# Patient Record
Sex: Male | Born: 1985 | Hispanic: No | Marital: Single | Smoking: Never smoker
Health system: Southern US, Community
[De-identification: ages and names within clinical notes are randomized; demographics above are authoritative.]

## PROBLEM LIST (undated history)

## (undated) DIAGNOSIS — S8990XA Unspecified injury of unspecified lower leg, initial encounter: Secondary | ICD-10-CM

## (undated) HISTORY — DX: Unspecified injury of unspecified lower leg, initial encounter: S89.90XA

---
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
## 2008-08-12 ENCOUNTER — Emergency Department: Payer: Self-pay

## 2008-08-12 ENCOUNTER — Encounter: Payer: Self-pay | Admitting: Family Medicine

## 2010-08-28 ENCOUNTER — Ambulatory Visit: Payer: Self-pay | Admitting: Family Medicine

## 2010-08-29 LAB — CONVERTED CEMR LAB
Calcium: 9.8 mg/dL (ref 8.4–10.5)
Cholesterol: 137 mg/dL (ref 0–200)
GFR calc non Af Amer: 82.96 mL/min (ref >60.00)
HDL: 34.9 mg/dL — ABNORMAL LOW (ref >39.00)
Potassium: 4.2 meq/L (ref 3.5–5.1)
Sodium: 141 meq/L (ref 135–145)
Triglycerides: 40 mg/dL (ref 0.0–149.0)
VLDL: 8 mg/dL (ref 0.0–40.0)

## 2010-10-16 NOTE — Letter (Signed)
Summary: Records from [facility] 2006 - 2009  Records from [facility] 2006 - 2009   Imported By: Maryln Gottron 09/16/2010 14:10:46  _____________________________________________________________________  External Attachment:    Type:   Image     Comment:   External Document

## 2010-10-16 NOTE — Assessment & Plan Note (Signed)
Summary: NEW PT TO EST/CLE   Vital Signs:  Patient profile:   25 year old male Height:      75.75 inches Weight:      164 pounds BMI:     20.17 Temp:     98.6 degrees F oral Pulse rate:   80 / minute Pulse rhythm:   regular BP sitting:   102 / 80  (right arm) Cuff size:   regular  Vitals Entered By: Linde Gillis CMA Duncan Dull) (August 28, 2010 10:45 AM) CC: new patient, establish care   History of Present Illness: 25 yo here to establish care with no complaints.  Had right knee/leg surgery requiring multiple screws after basketball injury in [facility].  Sometimes still hurts after exercising.  Very physically active- goes to the gym almost every day, also plays golf 2-3 times per week.  Wants to teach golf eventually.  Not sexually active.  Very close to his parents.  Preventive Screening-Counseling & Management  Alcohol-Tobacco     Smoking Status: never  Caffeine-Diet-Exercise     Does Patient Exercise: yes      Drug Use:  no.    Current Medications (verified): 1)  Vitamin C 500 Mg Chew (Ascorbic Acid) .... Take One Tablet By Mouth Daily  Allergies (verified): No Known Drug Allergies  Past History:  Family History: Last updated: 08/28/2010 Mom has CRF  Social History: Last updated: 08/28/2010 Single In college Regular exercise-yes Drug use-no Never Smoked Alcohol use-no [place] mom and stepdad  Risk Factors: Exercise: yes (08/28/2010)  Risk Factors: Smoking Status: never (08/28/2010)  Past Medical History: Unremarkable  Past Surgical History: Right knee/leg repair- multiple screws after basketball injury in 2004- [facility] [place]  Family History: Mom has CRF  Social History: Single In college Regular exercise-yes Drug use-no Never Smoked Alcohol use-no [place] with mom and stepdadDoes Patient Exercise:  yes Drug Use:  no Smoking Status:  never  Review of Systems      See HPI General:   Denies malaise. Eyes:  Denies blurring. ENT:  Denies difficulty swallowing. CV:  Denies chest pain or discomfort. Resp:  Denies shortness of breath. GI:  Denies abdominal pain, change in bowel habits, and constipation. GU:  Denies dysuria and genital sores. MS:  Denies joint pain, joint redness, and joint swelling. Derm:  Denies rash. Neuro:  Denies headaches. Psych:  Denies anxiety and depression. Endo:  Denies cold intolerance and heat intolerance. Heme:  Denies abnormal bruising and bleeding. Allergy:  Denies persistent infections and seasonal allergies.  Physical Exam  General:  alert, well-developed, well-nourished, and well-hydrated.   Head:  normocephalic and atraumatic.   Eyes:  vision grossly intact, pupils equal, and pupils round.   Ears:  R ear normal and L ear normal.   Nose:  no external deformity.   Mouth:  good dentition and no gingival abnormalities.   Neck:  No deformities, masses, or tenderness noted. Lungs:  Normal respiratory effort, chest expands symmetrically. Lungs are clear to auscultation, no crackles or wheezes. Heart:  Normal rate and regular rhythm. S1 and S2 normal without gallop, murmur, click, rub or other extra sounds. Abdomen:  Bowel sounds positive,abdomen soft and non-tender without masses, organomegaly or hernias noted. Msk:  No deformity or scoliosis noted of thoracic or lumbar spine.   Extremities:  no edema Neurologic:  alert & oriented X3 and gait normal.   Skin:  Intact without suspicious lesions or rashes Psych:  Cognition  and judgment appear intact. Alert and cooperative with normal attention span and concentration. No apparent delusions, illusions, hallucinations   Impression & Recommendations:  Problem # 1:  HEALTH SCREENING (ICD-V70.0) Reviewed preventive care protocols, scheduled due services, and updated immunizations Discussed nutrition, exercise, diet, and healthy lifestyle.  Orders: Venipuncture (70623) TLB-BMP (Basic  Metabolic Panel-BMET) (80048-METABOL)  Complete Medication List: 1)  Vitamin C 500 Mg Chew (Ascorbic acid) .... Take one tablet by mouth daily  Other Orders: TLB-Lipid Panel (80061-LIPID)   Orders Added: 1)  Venipuncture [36415] 2)  TLB-BMP (Basic Metabolic Panel-BMET) [80048-METABOL] 3)  TLB-Lipid Panel [80061-LIPID] 4)  New Patient 18-39 years [99385]    Current Allergies (reviewed today): No known allergies   Flu Vaccine Next Due:  Refused TD Result Date:  08/25/2006 TD Result:  historical

## 2010-12-18 ENCOUNTER — Encounter: Payer: Self-pay | Admitting: Family Medicine

## 2010-12-22 ENCOUNTER — Ambulatory Visit (INDEPENDENT_AMBULATORY_CARE_PROVIDER_SITE_OTHER): Payer: BLUE CROSS/BLUE SHIELD | Admitting: Family Medicine

## 2010-12-22 ENCOUNTER — Encounter: Payer: Self-pay | Admitting: Family Medicine

## 2010-12-22 VITALS — BP 116/78 | HR 84 | Temp 98.3°F | Ht 76.0 in | Wt 161.0 lb

## 2010-12-22 DIAGNOSIS — J069 Acute upper respiratory infection, unspecified: Secondary | ICD-10-CM

## 2010-12-22 MED ORDER — GUAIFENESIN-CODEINE 100-10 MG/5ML PO SYRP: 5.0000 mL | ORAL_SOLUTION | Freq: Three times a day (TID) | ORAL | Status: AC | PRN

## 2010-12-22 NOTE — Assessment & Plan Note (Signed)
supportive care as per instructions. cheratussin If not better in next few days, call us for likely zpack to cover atypicals in likely developing bronchitis.

## 2010-12-22 NOTE — Patient Instructions (Signed)
Sounds like you have a viral upper respiratory infection. Antibiotics are not needed for this.  Viral infections usually take 7-10 days to resolve.  The cough can last 4 weeks to go away. Use medication as prescribed: cheratussin at night Continue mucinex with plenty of fluid to help mobilize mucous out. Intranasal saline flush to drain sinuses. Push fluids and plenty of rest. Please return if you are not improving as expected, or if you have high fevers (>101.5) or difficulty swallowing or worsening productive cough. Call clinic with questions.  Pleasure to see you today.  If not feeling better by Thursday, or any worsening, call us with update.

## 2010-12-22 NOTE — Progress Notes (Signed)
  Subjective:    Patient ID: Jonathan Stewart, male    DOB: 1985/12/19, 25 y.o.   MRN: [id number]  HPI CC: ? Sinus infection  7d h/o nasal congestion, now seems to be going into throat and chest.  Affecting ability to work, decreasing sleep at night.  Trouble breathing and cough at night.  Using zyrtec D as well as mucinex, didn't help.  + myalgias.  + HA, sinus pressure.  Cough productive of clear mucous.  Nothing coming out of nose.  + sneezing and watery eyes initially, then progressed to more congestion.  Now congestion centered in chest.  No fevers/chills, abd pain, n/v/d, arthralgias, HA.  Both parents sick as well.    H/o bad allergies.  No h/o asthma, no smokers at home  Review of Systems Per HPI    Objective:   Physical Exam  [vitalsreviewed. Constitutional: He appears well-developed and well-nourished. No distress.  HENT:  Head: Normocephalic and atraumatic.  Right Ear: Tympanic membrane, external ear and ear canal normal.  Left Ear: Tympanic membrane, external ear and ear canal normal.  Nose: No mucosal edema or rhinorrhea. Right sinus exhibits maxillary sinus tenderness (mild). Right sinus exhibits no frontal sinus tenderness. Left sinus exhibits no maxillary sinus tenderness and no frontal sinus tenderness.  Mouth/Throat: Oropharynx is clear and moist. No oropharyngeal exudate.  Eyes: Conjunctivae and EOM are normal. Pupils are equal, round, and reactive to light. No scleral icterus.  Neck: Normal range of motion. Neck supple. No thyromegaly present.  Cardiovascular: Normal rate, regular rhythm, normal heart sounds and intact distal pulses.   No murmur heard. Pulmonary/Chest: Effort normal and breath sounds normal. No respiratory distress. He has no wheezes. He has no rales.  Lymphadenopathy:    He has no cervical adenopathy.  Skin: Skin is warm and dry. No rash noted.          Assessment & Plan:

## 2012-08-15 ENCOUNTER — Ambulatory Visit (INDEPENDENT_AMBULATORY_CARE_PROVIDER_SITE_OTHER): Payer: BLUE CROSS/BLUE SHIELD | Admitting: Family Medicine

## 2012-08-15 ENCOUNTER — Encounter: Payer: Self-pay | Admitting: Family Medicine

## 2012-08-15 VITALS — BP 124/90 | Temp 98.0°F | Wt 162.0 lb

## 2012-08-15 DIAGNOSIS — K529 Noninfective gastroenteritis and colitis, unspecified: Secondary | ICD-10-CM

## 2012-08-15 DIAGNOSIS — K5289 Other specified noninfective gastroenteritis and colitis: Secondary | ICD-10-CM

## 2012-08-15 MED ORDER — ONDANSETRON HCL 4 MG PO TABS: 4.0000 mg | ORAL_TABLET | Freq: Three times a day (TID) | ORAL | Status: DC | PRN

## 2012-08-15 NOTE — Patient Instructions (Addendum)
Viral Gastroenteritis Viral gastroenteritis is also known as stomach flu. This condition affects the stomach and intestinal tract. It can cause sudden diarrhea and vomiting. The illness typically lasts 3 to 8 days. Most people develop an immune response that eventually gets rid of the virus. While this natural response develops, the virus can make you quite ill. CAUSES  Many different viruses can cause gastroenteritis, such as rotavirus or noroviruses. You can catch one of these viruses by consuming contaminated food or water. You may also catch a virus by sharing utensils or other personal items with an infected person or by touching a contaminated surface. SYMPTOMS  The most common symptoms are diarrhea and vomiting. These problems can cause a severe loss of body fluids (dehydration) and a body salt (electrolyte) imbalance. Other symptoms may include:  Fever.  Headache.  Fatigue.  Abdominal pain. DIAGNOSIS  Your caregiver can usually diagnose viral gastroenteritis based on your symptoms and a physical exam. A stool sample may also be taken to test for the presence of viruses or other infections. TREATMENT  This illness typically goes away on its own. Treatments are aimed at rehydration. The most serious cases of viral gastroenteritis involve vomiting so severely that you are not able to keep fluids down. In these cases, fluids must be given through an intravenous line (IV). HOME CARE INSTRUCTIONS   Drink enough fluids to keep your urine clear or pale yellow. Drink small amounts of fluids frequently and increase the amounts as tolerated.  Ask your caregiver for specific rehydration instructions.  Avoid:  Foods high in sugar.  Alcohol.  Carbonated drinks.  Tobacco.  Juice.  Caffeine drinks.  Extremely hot or cold fluids.  Fatty, greasy foods.  Too much intake of anything at one time.  Dairy products until 24 to 48 hours after diarrhea stops.  You may consume probiotics.  Probiotics are active cultures of beneficial bacteria. They may lessen the amount and number of diarrheal stools in adults. Probiotics can be found in yogurt with active cultures and in supplements.  Wash your hands well to avoid spreading the virus.  Only take over-the-counter or prescription medicines for pain, discomfort, or fever as directed by your caregiver. Do not give aspirin to children. Antidiarrheal medicines are not recommended.  Ask your caregiver if you should continue to take your regular prescribed and over-the-counter medicines.  Keep all follow-up appointments as directed by your caregiver. SEEK IMMEDIATE MEDICAL CARE IF:   You are unable to keep fluids down.  You do not urinate at least once every 6 to 8 hours.  You develop shortness of breath.  You notice blood in your stool or vomit. This may look like coffee grounds.  You have abdominal pain that increases or is concentrated in one small area (localized).  You have persistent vomiting or diarrhea.  You have a fever.  The patient is a child younger than 3 months, and he or she has a fever.  The patient is a child older than 3 months, and he or she has a fever and persistent symptoms.  The patient is a child older than 3 months, and he or she has a fever and symptoms suddenly get worse.  The patient is a baby, and he or she has no tears when crying. MAKE SURE YOU:   Understand these instructions.  Will watch your condition.  Will get help right away if you are not doing well or get worse. Document Released: 08/31/2005 Document Revised: 11/23/2011 Document Reviewed: 06/17/2011   ExitCare Patient Information 2013 [employer].  

## 2012-08-15 NOTE — Progress Notes (Signed)
(  S) Jonathan Stewart is a 26 y.o. male with complaint of gastrointestinal symptoms of watery diarrhea, abdominal pain, lower abdominal cramps, nausea, vomiting for 2 days. No blood in stool.  Patient Active Problem List  Diagnosis  . Viral URI with cough   No past medical history on file. No past surgical history on file. History  Substance Use Topics  . Smoking status: Never Smoker   . Smokeless tobacco: Not on file  . Alcohol Use: Yes     Comment: Occasional   No family history on file. No Known Allergies Current Outpatient Prescriptions on File Prior to Visit  Medication Sig Dispense Refill  . Ascorbic Acid (VITAMIN C) 500 MG CHEW Chew 1 tablet by mouth daily.        . cetirizine-pseudoephedrine (ZYRTEC-D) 5-120 MG per tablet Take 1 tablet by mouth 2 (two) times daily as needed.        . fish oil-omega-3 fatty acids 1000 MG capsule Take 1 g by mouth daily.        . Glucosamine 500 MG CAPS Take 1 capsule by mouth daily.        . Multiple Vitamin (MULTIVITAMIN) tablet Take 1 tablet by mouth daily.         The PMH, PSH, Social History, Family History, Medications, and allergies have been reviewed in [facility], and have been updated if relevant.  (O) BP 124/90  Temp 98 F (36.7 C)  Wt 162 lb (73.483 kg)   Physical exam reveals the patient appears well. Hydration status: well hydrated. Abdomen: abdomen is soft without significant tenderness, masses, organomegaly or guarding..  (A) Viral Gastroenteritis  (P) 8 mg Zofran ODT given in office.  I have recommended small amounts clear fluids frequently, soups, juices, water and advance diet as tolerated. Return office visit if symptoms persist or worsen; I have alerted the patient to call if high fever, dehydration, marked weakness, fainting, increased abdominal pain, blood in stool or vomit.

## 2012-08-16 MED ORDER — ONDANSETRON 4 MG PO TBDP
4.0000 mg | ORAL_TABLET | Freq: Once | ORAL | Status: AC
  Administered 2012-08-15: 4 mg via ORAL

## 2012-08-16 NOTE — Addendum Note (Signed)
Addended by: Eliezer Bottom on: 08/16/2012 05:15 PM   Modules accepted: Orders

## 2013-12-18 ENCOUNTER — Ambulatory Visit (INDEPENDENT_AMBULATORY_CARE_PROVIDER_SITE_OTHER): Payer: BC Managed Care – PPO | Admitting: Internal Medicine

## 2013-12-18 ENCOUNTER — Encounter: Payer: Self-pay | Admitting: Internal Medicine

## 2013-12-18 VITALS — BP 118/76 | HR 82 | Temp 98.9°F | Wt 171.8 lb

## 2013-12-18 DIAGNOSIS — K529 Noninfective gastroenteritis and colitis, unspecified: Secondary | ICD-10-CM

## 2013-12-18 DIAGNOSIS — K5289 Other specified noninfective gastroenteritis and colitis: Secondary | ICD-10-CM

## 2013-12-18 MED ORDER — ONDANSETRON HCL 4 MG PO TABS: 4.0000 mg | ORAL_TABLET | Freq: Three times a day (TID) | ORAL | Status: AC | PRN

## 2013-12-18 NOTE — Patient Instructions (Addendum)
Viral Gastroenteritis Viral gastroenteritis is also known as stomach flu. This condition affects the stomach and intestinal tract. It can cause sudden diarrhea and vomiting. The illness typically lasts 3 to 8 days. Most people develop an immune response that eventually gets rid of the virus. While this natural response develops, the virus can make you quite ill. CAUSES  Many different viruses can cause gastroenteritis, such as rotavirus or noroviruses. You can catch one of these viruses by consuming contaminated food or water. You may also catch a virus by sharing utensils or other personal items with an infected person or by touching a contaminated surface. SYMPTOMS  The most common symptoms are diarrhea and vomiting. These problems can cause a severe loss of body fluids (dehydration) and a body salt (electrolyte) imbalance. Other symptoms may include:  Fever.  Headache.  Fatigue.  Abdominal pain. DIAGNOSIS  Your caregiver can usually diagnose viral gastroenteritis based on your symptoms and a physical exam. A stool sample may also be taken to test for the presence of viruses or other infections. TREATMENT  This illness typically goes away on its own. Treatments are aimed at rehydration. The most serious cases of viral gastroenteritis involve vomiting so severely that you are not able to keep fluids down. In these cases, fluids must be given through an intravenous line (IV). HOME CARE INSTRUCTIONS   Drink enough fluids to keep your urine clear or pale yellow. Drink small amounts of fluids frequently and increase the amounts as tolerated.  Ask your caregiver for specific rehydration instructions.  Avoid:  Foods high in sugar.  Alcohol.  Carbonated drinks.  Tobacco.  Juice.  Caffeine drinks.  Extremely hot or cold fluids.  Fatty, greasy foods.  Too much intake of anything at one time.  Dairy products until 24 to 48 hours after diarrhea stops.  You may consume probiotics.  Probiotics are active cultures of beneficial bacteria. They may lessen the amount and number of diarrheal stools in adults. Probiotics can be found in yogurt with active cultures and in supplements.  Wash your hands well to avoid spreading the virus.  Only take over-the-counter or prescription medicines for pain, discomfort, or fever as directed by your caregiver. Do not give aspirin to children. Antidiarrheal medicines are not recommended.  Ask your caregiver if you should continue to take your regular prescribed and over-the-counter medicines.  Keep all follow-up appointments as directed by your caregiver. SEEK IMMEDIATE MEDICAL CARE IF:   You are unable to keep fluids down.  You do not urinate at least once every 6 to 8 hours.  You develop shortness of breath.  You notice blood in your stool or vomit. This may look like coffee grounds.  You have abdominal pain that increases or is concentrated in one small area (localized).  You have persistent vomiting or diarrhea.  You have a fever.  The patient is a child younger than 3 months, and he or she has a fever.  The patient is a child older than 3 months, and he or she has a fever and persistent symptoms.  The patient is a child older than 3 months, and he or she has a fever and symptoms suddenly get worse.  The patient is a baby, and he or she has no tears when crying. MAKE SURE YOU:   Understand these instructions.  Will watch your condition.  Will get help right away if you are not doing well or get worse. Document Released: 08/31/2005 Document Revised: 11/23/2011 Document Reviewed: 06/17/2011   [employer] Patient Information 2014 [employer].  

## 2013-12-18 NOTE — Progress Notes (Signed)
Subjective:    Patient ID: Jonathan Stewart, male    DOB: 1986-03-02, 28 y.o.   MRN: [id number]  HPI  Pt presents to the clinic today with c/o abdominal cramping, diarrhea, nausea and vomiting. He reports this started. He has been running low grade fevers. Has had chills and body aches. He is unsure if he has had sick contacts but he reports his mom is having similar symptoms. He is concerned because his mom had a kidney transplant 1 year ago.  Review of Systems   History reviewed. No pertinent past medical history.  Current Outpatient Prescriptions  Medication Sig Dispense Refill  . Ascorbic Acid (VITAMIN C) 500 MG CHEW Chew 1 tablet by mouth daily.        . fish oil-omega-3 fatty acids 1000 MG capsule Take 1 g by mouth daily.        . Glucosamine 500 MG CAPS Take 1 capsule by mouth daily.        . Multiple Vitamin (MULTIVITAMIN) tablet Take 1 tablet by mouth daily.         No current facility-administered medications for this visit.    No Known Allergies  History reviewed. No pertinent family history.  History   Social History  . Marital Status: Single    Spouse Name: N/A    Number of Children: N/A  . Years of Education: N/A   Occupational History  . Not on file.   Social History Main Topics  . Smoking status: Never Smoker   . Smokeless tobacco: Not on file  . Alcohol Use: Yes     Comment: Occasional  . Drug Use: No  . Sexual Activity: Not on file   Other Topics Concern  . Not on file   Social History Narrative  . No narrative on file     Constitutional: Denies fever, malaise, fatigue, headache or abrupt weight changes.  HEENT: Denies eye pain, eye redness, ear pain, ringing in the ears, wax buildup, runny nose, nasal congestion, bloody nose, or sore throat. Respiratory: Denies difficulty breathing, shortness of breath, cough or sputum production.   Cardiovascular: Denies chest pain, chest tightness, palpitations or swelling in the hands or feet.    Gastrointestinal: Pt reports abdominal cramping, nausea, vomiting, diarrhea. Denies bloating, constipation, or blood in the stool.     No other specific complaints in a complete review of systems (except as listed in HPI above).   Objective:   Physical Exam  BP 118/76  Pulse 82  Temp(Src) 98.9 F (37.2 C) (Oral)  Wt 171 lb 12 oz (77.905 kg)  SpO2 98% Wt Readings from Last 3 Encounters:  12/18/13 171 lb 12 oz (77.905 kg)  08/15/12 162 lb (73.483 kg)  12/22/10 161 lb (73.029 kg)    General: Appears his stated age, well developed, well nourished in NAD. Cardiovascular: Normal rate and rhythm. S1,S2 noted.  No murmur, rubs or gallops noted. No JVD or BLE edema. No carotid bruits noted. Pulmonary/Chest: Normal effort and positive vesicular breath sounds. No respiratory distress. No wheezes, rales or ronchi noted.  Abdomen: Soft and diffusely tender. Normal bowel sounds, no bruits noted. No distention or masses noted. Liver, spleen and kidneys non palpable.        Assessment & Plan:   Gastroenteritis, viral:  Encouraged him to get rest and drink lots of fluids eRx for zofran Wash hands thoroughly to prevent spreading the virus I would encourage him to stay away from his mom at all possible. Work note  provided  RTC as needed or if symptoms persist or worsen

## 2013-12-18 NOTE — Progress Notes (Signed)
Pre visit review using our clinic review tool, if applicable. No additional management support is needed unless otherwise documented below in the visit note. 

## 2014-04-08 ENCOUNTER — Inpatient Hospital Stay (HOSPITAL_COMMUNITY)
Admission: EM | Admit: 2014-04-08 | Discharge: 2014-04-14 | DRG: 963 | Disposition: E | Payer: BC Managed Care – PPO | Attending: Surgery | Admitting: Surgery

## 2014-04-08 ENCOUNTER — Emergency Department (HOSPITAL_COMMUNITY): Payer: BC Managed Care – PPO

## 2014-04-08 DIAGNOSIS — S42009A Fracture of unspecified part of unspecified clavicle, initial encounter for closed fracture: Secondary | ICD-10-CM

## 2014-04-08 DIAGNOSIS — D62 Acute posthemorrhagic anemia: Secondary | ICD-10-CM | POA: Diagnosis present

## 2014-04-08 DIAGNOSIS — R000 Tachycardia, unspecified: Secondary | ICD-10-CM | POA: Diagnosis present

## 2014-04-08 DIAGNOSIS — S2220XA Unspecified fracture of sternum, initial encounter for closed fracture: Secondary | ICD-10-CM

## 2014-04-08 DIAGNOSIS — I469 Cardiac arrest, cause unspecified: Secondary | ICD-10-CM | POA: Diagnosis present

## 2014-04-08 DIAGNOSIS — J9600 Acute respiratory failure, unspecified whether with hypoxia or hypercapnia: Secondary | ICD-10-CM | POA: Diagnosis present

## 2014-04-08 DIAGNOSIS — G9382 Brain death: Secondary | ICD-10-CM | POA: Diagnosis present

## 2014-04-08 DIAGNOSIS — S32309A Unspecified fracture of unspecified ilium, initial encounter for closed fracture: Secondary | ICD-10-CM

## 2014-04-08 DIAGNOSIS — IMO0002 Reserved for concepts with insufficient information to code with codable children: Secondary | ICD-10-CM | POA: Diagnosis present

## 2014-04-08 DIAGNOSIS — I10 Essential (primary) hypertension: Secondary | ICD-10-CM | POA: Diagnosis present

## 2014-04-08 DIAGNOSIS — T794XXA Traumatic shock, initial encounter: Secondary | ICD-10-CM | POA: Diagnosis present

## 2014-04-08 DIAGNOSIS — J9383 Other pneumothorax: Secondary | ICD-10-CM | POA: Diagnosis present

## 2014-04-08 DIAGNOSIS — S066XAA Traumatic subarachnoid hemorrhage with loss of consciousness status unknown, initial encounter: Secondary | ICD-10-CM

## 2014-04-08 DIAGNOSIS — E876 Hypokalemia: Secondary | ICD-10-CM | POA: Diagnosis present

## 2014-04-08 DIAGNOSIS — R578 Other shock: Secondary | ICD-10-CM | POA: Diagnosis not present

## 2014-04-08 DIAGNOSIS — J9601 Acute respiratory failure with hypoxia: Secondary | ICD-10-CM

## 2014-04-08 DIAGNOSIS — S129XXA Fracture of neck, unspecified, initial encounter: Secondary | ICD-10-CM | POA: Diagnosis present

## 2014-04-08 DIAGNOSIS — S066X9A Traumatic subarachnoid hemorrhage with loss of consciousness of unspecified duration, initial encounter: Secondary | ICD-10-CM

## 2014-04-08 DIAGNOSIS — S14119A Complete lesion at unspecified level of cervical spinal cord, initial encounter: Secondary | ICD-10-CM

## 2014-04-08 LAB — CBC
HCT: 41.3 % (ref 39.0–52.0)
Hemoglobin: 13.9 g/dL (ref 13.0–17.0)
MCH: 29 pg (ref 26.0–34.0)
MCHC: 33.7 g/dL (ref 30.0–36.0)
MCV: 86 fL (ref 78.0–100.0)
Platelets: 248 10*3/uL (ref 150–400)
RBC: 4.8 MIL/uL (ref 4.22–5.81)
RDW: 12.6 % (ref 11.5–15.5)
WBC: 19.1 10*3/uL — ABNORMAL HIGH (ref 4.0–10.5)

## 2014-04-08 LAB — URINE MICROSCOPIC-ADD ON

## 2014-04-08 LAB — ETHANOL: Alcohol, Ethyl (B): <11 mg/dL (ref 0–11)

## 2014-04-08 LAB — I-STAT CHEM 8, ED
BUN: 15 mg/dL (ref 6–23)
CALCIUM ION: 1.06 mmol/L — AB (ref 1.12–1.23)
Chloride: 107 mEq/L (ref 96–112)
Creatinine, Ser: 1.4 mg/dL — ABNORMAL HIGH (ref 0.50–1.35)
Glucose, Bld: 210 mg/dL — ABNORMAL HIGH (ref 70–99)
HCT: 43 % (ref 39.0–52.0)
Hemoglobin: 14.6 g/dL (ref 13.0–17.0)
Potassium: 2.8 mEq/L — CL (ref 3.7–5.3)
Sodium: 138 mEq/L (ref 137–147)
TCO2: 19 mmol/L (ref 0–100)

## 2014-04-08 LAB — URINALYSIS, ROUTINE W REFLEX MICROSCOPIC
Bilirubin Urine: NEGATIVE
Glucose, UA: 250 mg/dL — AB
Ketones, ur: NEGATIVE mg/dL
LEUKOCYTES UA: NEGATIVE
Nitrite: NEGATIVE
PROTEIN: 30 mg/dL — AB
Specific Gravity, Urine: 1.014 (ref 1.005–1.030)
Urobilinogen, UA: 0.2 mg/dL (ref 0.0–1.0)
pH: 6.5 (ref 5.0–8.0)

## 2014-04-08 LAB — COMPREHENSIVE METABOLIC PANEL
ALT: 118 U/L — ABNORMAL HIGH (ref 0–53)
AST: 197 U/L — ABNORMAL HIGH (ref 0–37)
Albumin: 3.8 g/dL (ref 3.5–5.2)
Alkaline Phosphatase: 108 U/L (ref 39–117)
Anion gap: 20 — ABNORMAL HIGH (ref 5–15)
BUN: 15 mg/dL (ref 6–23)
CO2: 18 meq/L — AB (ref 19–32)
CREATININE: 1.21 mg/dL (ref 0.50–1.35)
Calcium: 8.1 mg/dL — ABNORMAL LOW (ref 8.4–10.5)
Chloride: 102 mEq/L (ref 96–112)
GFR, EST NON AFRICAN AMERICAN: 81 mL/min — AB (ref >90)
Glucose, Bld: 208 mg/dL — ABNORMAL HIGH (ref 70–99)
Potassium: 3.1 mEq/L — ABNORMAL LOW (ref 3.7–5.3)
Sodium: 140 mEq/L (ref 137–147)
Total Bilirubin: 0.2 mg/dL — ABNORMAL LOW (ref 0.3–1.2)
Total Protein: 7.3 g/dL (ref 6.0–8.3)

## 2014-04-08 LAB — PROTIME-INR
INR: 1.31 (ref 0.00–1.49)
Prothrombin Time: 16.3 seconds — ABNORMAL HIGH (ref 11.6–15.2)

## 2014-04-08 LAB — I-STAT TROPONIN, ED: Troponin i, poc: 0 ng/mL (ref 0.00–0.08)

## 2014-04-08 LAB — I-STAT CG4 LACTIC ACID, ED: Lactic Acid, Venous: 4.91 mmol/L — ABNORMAL HIGH (ref 0.5–2.2)

## 2014-04-08 LAB — CDS SEROLOGY

## 2014-04-08 MED ORDER — IOHEXOL 300 MG/ML  SOLN: 100.0000 mL | Freq: Once | INTRAMUSCULAR | Status: AC | PRN

## 2014-04-08 MED ORDER — NOREPINEPHRINE BITARTRATE 1 MG/ML IV SOLN
2.0000 ug/min | Freq: Once | INTRAVENOUS | Status: AC
  Administered 2014-04-08: 10 ug/min via INTRAVENOUS
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
## 2014-04-08 NOTE — ED Notes (Signed)
Pt returned from CT with Primary RN, Nolon RodKelly D. And EMT Marchelle FolksAmanda.

## 2014-04-08 NOTE — ED Notes (Signed)
Family updated as to patient's status. Mother made aware that pt is in the ED. Family states that they are coming.
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
## 2014-04-09 ENCOUNTER — Encounter (HOSPITAL_COMMUNITY)
Admission: EM | Admit: 2014-04-09 | Discharge: 2014-04-09 | Disposition: A | Discharge status: Home / Self Care | Payer: BC Managed Care – PPO | Source: Ambulatory Visit | Attending: Surgery | Admitting: Surgery

## 2014-04-09 ENCOUNTER — Inpatient Hospital Stay (HOSPITAL_COMMUNITY): Payer: BC Managed Care – PPO

## 2014-04-09 DIAGNOSIS — S270XXA Traumatic pneumothorax, initial encounter: Secondary | ICD-10-CM

## 2014-04-09 DIAGNOSIS — S2220XA Unspecified fracture of sternum, initial encounter for closed fracture: Secondary | ICD-10-CM

## 2014-04-09 DIAGNOSIS — S129XXA Fracture of neck, unspecified, initial encounter: Secondary | ICD-10-CM | POA: Diagnosis present

## 2014-04-09 DIAGNOSIS — IMO0002 Reserved for concepts with insufficient information to code with codable children: Secondary | ICD-10-CM

## 2014-04-09 DIAGNOSIS — S066XAA Traumatic subarachnoid hemorrhage with loss of consciousness status unknown, initial encounter: Secondary | ICD-10-CM

## 2014-04-09 DIAGNOSIS — S14119A Complete lesion at unspecified level of cervical spinal cord, initial encounter: Secondary | ICD-10-CM

## 2014-04-09 DIAGNOSIS — J9600 Acute respiratory failure, unspecified whether with hypoxia or hypercapnia: Secondary | ICD-10-CM

## 2014-04-09 DIAGNOSIS — S066X9A Traumatic subarachnoid hemorrhage with loss of consciousness of unspecified duration, initial encounter: Secondary | ICD-10-CM

## 2014-04-09 DIAGNOSIS — Z529 Donor of unspecified organ or tissue: Secondary | ICD-10-CM | POA: Insufficient documentation

## 2014-04-09 LAB — GAMMA GT: GGT: 43 U/L (ref 7–51)

## 2014-04-09 LAB — CBC WITH DIFFERENTIAL/PLATELET
Basophils Absolute: 0 10*3/uL (ref 0.0–0.1)
Basophils Relative: 0 % (ref 0–1)
EOS ABS: 0 10*3/uL (ref 0.0–0.7)
EOS PCT: 0 % (ref 0–5)
HCT: 25.9 % — ABNORMAL LOW (ref 39.0–52.0)
Hemoglobin: 9.1 g/dL — ABNORMAL LOW (ref 13.0–17.0)
Lymphocytes Relative: 6 % — ABNORMAL LOW (ref 12–46)
Lymphs Abs: 0.8 10*3/uL (ref 0.7–4.0)
MCH: 29.2 pg (ref 26.0–34.0)
MCHC: 35.1 g/dL (ref 30.0–36.0)
MCV: 83 fL (ref 78.0–100.0)
MONOS PCT: 5 % (ref 3–12)
Monocytes Absolute: 0.6 10*3/uL (ref 0.1–1.0)
NEUTROS PCT: 89 % — AB (ref 43–77)
Neutro Abs: 11.6 10*3/uL — ABNORMAL HIGH (ref 1.7–7.7)
Platelets: 146 10*3/uL — ABNORMAL LOW (ref 150–400)
RBC: 3.12 MIL/uL — ABNORMAL LOW (ref 4.22–5.81)
RDW: 12.8 % (ref 11.5–15.5)
WBC: 13 10*3/uL — ABNORMAL HIGH (ref 4.0–10.5)

## 2014-04-09 LAB — URINALYSIS, ROUTINE W REFLEX MICROSCOPIC
BILIRUBIN URINE: NEGATIVE
Glucose, UA: 250 mg/dL — AB
Hgb urine dipstick: NEGATIVE
Ketones, ur: NEGATIVE mg/dL
Leukocytes, UA: NEGATIVE
Nitrite: NEGATIVE
Protein, ur: NEGATIVE mg/dL
SPECIFIC GRAVITY, URINE: 1.029 (ref 1.005–1.030)
UROBILINOGEN UA: 0.2 mg/dL (ref 0.0–1.0)
pH: 5 (ref 5.0–8.0)

## 2014-04-09 LAB — COMPREHENSIVE METABOLIC PANEL
ALBUMIN: 3.3 g/dL — AB (ref 3.5–5.2)
ALK PHOS: 69 U/L (ref 39–117)
ALT: 73 U/L — ABNORMAL HIGH (ref 0–53)
ALT: 71 U/L — ABNORMAL HIGH (ref 0–53)
ANION GAP: 9 (ref 5–15)
AST: 84 U/L — AB (ref 0–37)
AST: 75 U/L — AB (ref 0–37)
Albumin: 2.8 g/dL — ABNORMAL LOW (ref 3.5–5.2)
Alkaline Phosphatase: 67 U/L (ref 39–117)
Anion gap: 9 (ref 5–15)
BILIRUBIN TOTAL: 0.3 mg/dL (ref 0.3–1.2)
BUN: 12 mg/dL (ref 6–23)
BUN: 11 mg/dL (ref 6–23)
CALCIUM: 7.9 mg/dL — AB (ref 8.4–10.5)
CHLORIDE: 106 meq/L (ref 96–112)
CO2: 23 mEq/L (ref 19–32)
CO2: 26 mEq/L (ref 19–32)
CREATININE: 1.23 mg/dL (ref 0.50–1.35)
Calcium: 7.3 mg/dL — ABNORMAL LOW (ref 8.4–10.5)
Chloride: 106 mEq/L (ref 96–112)
Creatinine, Ser: 1.24 mg/dL (ref 0.50–1.35)
GFR calc Af Amer: >90 mL/min (ref >90)
GFR calc non Af Amer: 80 mL/min — ABNORMAL LOW (ref >90)
GFR calc non Af Amer: 79 mL/min — ABNORMAL LOW (ref >90)
GLUCOSE: 124 mg/dL — AB (ref 70–99)
Glucose, Bld: 164 mg/dL — ABNORMAL HIGH (ref 70–99)
POTASSIUM: 5 meq/L (ref 3.7–5.3)
Potassium: 5.1 mEq/L (ref 3.7–5.3)
SODIUM: 141 meq/L (ref 137–147)
Sodium: 138 mEq/L (ref 137–147)
TOTAL PROTEIN: 6.2 g/dL (ref 6.0–8.3)
Total Bilirubin: 0.4 mg/dL (ref 0.3–1.2)
Total Protein: 5.5 g/dL — ABNORMAL LOW (ref 6.0–8.3)

## 2014-04-09 LAB — APTT: aPTT: 32 seconds (ref 24–37)

## 2014-04-09 LAB — CBC
HEMATOCRIT: 26.9 % — AB (ref 39.0–52.0)
Hemoglobin: 9.3 g/dL — ABNORMAL LOW (ref 13.0–17.0)
MCH: 28.9 pg (ref 26.0–34.0)
MCHC: 34.6 g/dL (ref 30.0–36.0)
MCV: 83.5 fL (ref 78.0–100.0)
Platelets: 170 10*3/uL (ref 150–400)
RBC: 3.22 MIL/uL — AB (ref 4.22–5.81)
RDW: 12.8 % (ref 11.5–15.5)
WBC: 11.6 10*3/uL — AB (ref 4.0–10.5)

## 2014-04-09 LAB — TYPE AND SCREEN
ABO/RH(D): A NEG
Antibody Screen: NEGATIVE
PT AG Type: NEGATIVE
UNIT DIVISION: 0
Unit division: 0
Weak D: NEGATIVE

## 2014-04-09 LAB — POCT I-STAT 3, ART BLOOD GAS (G3+)
Acid-Base Excess: 1 mmol/L (ref 0.0–2.0)
BICARBONATE: 27.7 meq/L — AB (ref 20.0–24.0)
O2 Saturation: 100 %
PCO2 ART: 52.6 mmHg — AB (ref 35.0–45.0)
PH ART: 7.329 — AB (ref 7.350–7.450)
PO2 ART: 478 mmHg — AB (ref 80.0–100.0)
TCO2: 29 mmol/L (ref 0–100)

## 2014-04-09 LAB — DIFFERENTIAL
Basophils Absolute: 0 10*3/uL (ref 0.0–0.1)
Basophils Relative: 0 % (ref 0–1)
EOS PCT: 0 % (ref 0–5)
Eosinophils Absolute: 0 10*3/uL (ref 0.0–0.7)
LYMPHS ABS: 2.4 10*3/uL (ref 0.7–4.0)
LYMPHS PCT: 20 % (ref 12–46)
MONO ABS: 1.1 10*3/uL — AB (ref 0.1–1.0)
Monocytes Relative: 10 % (ref 3–12)
Neutro Abs: 8.1 10*3/uL — ABNORMAL HIGH (ref 1.7–7.7)
Neutrophils Relative %: 70 % (ref 43–77)

## 2014-04-09 LAB — LACTATE DEHYDROGENASE: LDH: 267 U/L — AB (ref 94–250)

## 2014-04-09 LAB — LIPASE, BLOOD: LIPASE: 187 U/L — AB (ref 11–59)

## 2014-04-09 LAB — PREPARE FRESH FROZEN PLASMA
UNIT DIVISION: 0
Unit division: 0

## 2014-04-09 LAB — BILIRUBIN, DIRECT: Bilirubin, Direct: <0.2 mg/dL (ref 0.0–0.3)

## 2014-04-09 LAB — AMYLASE: Amylase: 236 U/L — ABNORMAL HIGH (ref 0–105)

## 2014-04-09 LAB — MAGNESIUM: MAGNESIUM: 1.9 mg/dL (ref 1.5–2.5)

## 2014-04-09 LAB — HEMOGLOBIN A1C
Hgb A1c MFr Bld: 5.6 % (ref <5.7)
MEAN PLASMA GLUCOSE: 114 mg/dL (ref <117)

## 2014-04-09 LAB — ABO/RH: ABO/RH(D): A NEG

## 2014-04-09 LAB — PHOSPHORUS: PHOSPHORUS: 3.4 mg/dL (ref 2.3–4.6)

## 2014-04-09 LAB — PROTIME-INR
INR: 1.21 (ref 0.00–1.49)
Prothrombin Time: 15.3 seconds — ABNORMAL HIGH (ref 11.6–15.2)

## 2014-04-09 LAB — MRSA PCR SCREENING: MRSA BY PCR: NEGATIVE

## 2014-04-09 MED ORDER — POTASSIUM CHLORIDE 10 MEQ/100ML IV SOLN
10.0000 meq | INTRAVENOUS | Status: AC
  Administered 2014-04-09 (×4): 10 meq via INTRAVENOUS
  Filled 2014-04-09 (×4): qty 100

## 2014-04-09 MED ORDER — PHENYLEPHRINE HCL 10 MG/ML IJ SOLN
30.0000 ug/min | INTRAMUSCULAR | Status: DC
  Filled 2014-04-09: qty 4

## 2014-04-09 MED ORDER — CHLORHEXIDINE GLUCONATE 0.12 % MT SOLN
15.0000 mL | Freq: Two times a day (BID) | OROMUCOSAL | Status: DC
  Administered 2014-04-09 – 2014-04-10 (×4): 15 mL via OROMUCOSAL
  Filled 2014-04-09 (×4): qty 15

## 2014-04-09 MED ORDER — SODIUM CHLORIDE 0.9 % IV SOLN
10.0000 ug/h | INTRAVENOUS | Status: DC
  Administered 2014-04-09: 10 ug/h via INTRAVENOUS
  Administered 2014-04-10 (×2): 20 ug/h via INTRAVENOUS
  Filled 2014-04-09 (×3): qty 10

## 2014-04-09 MED ORDER — LEVOTHYROXINE SODIUM 100 MCG IV SOLR
20.0000 ug | Freq: Once | INTRAVENOUS | Status: AC
  Administered 2014-04-09: 20 ug via INTRAVENOUS
  Filled 2014-04-09: qty 5

## 2014-04-09 MED ORDER — FUROSEMIDE 10 MG/ML IJ SOLN
20.0000 mg | Freq: Once | INTRAMUSCULAR | Status: AC
  Administered 2014-04-09: 20 mg via INTRAVENOUS
  Filled 2014-04-09: qty 2

## 2014-04-09 MED ORDER — DEXTROSE 50 % IV SOLN
50.0000 mL | Freq: Once | INTRAVENOUS | Status: AC
  Administered 2014-04-09: 50 mL via INTRAVENOUS
  Filled 2014-04-09: qty 50

## 2014-04-09 MED ORDER — SODIUM CHLORIDE 0.9 % IV SOLN
1000.0000 mg | Freq: Once | INTRAVENOUS | Status: AC
  Filled 2014-04-09: qty 8

## 2014-04-09 MED ORDER — DEXTROSE 5 % IV SOLN
2.0000 ug/min | INTRAVENOUS | Status: DC
  Administered 2014-04-09: 40 ug/min via INTRAVENOUS
  Filled 2014-04-09: qty 16

## 2014-04-09 MED ORDER — DEXTROSE 5 % IV SOLN
2.0000 ug/min | INTRAVENOUS | Status: DC
  Administered 2014-04-09: 50 ug/min via INTRAVENOUS
  Administered 2014-04-09: 40 ug/min via INTRAVENOUS
  Administered 2014-04-09 (×3): 50 ug/min via INTRAVENOUS
  Administered 2014-04-09: 40 ug/min via INTRAVENOUS
  Administered 2014-04-09: 50 ug/min via INTRAVENOUS
  Filled 2014-04-09 (×8): qty 4

## 2014-04-09 MED ORDER — BIOTENE DRY MOUTH MT LIQD
15.0000 mL | Freq: Four times a day (QID) | OROMUCOSAL | Status: DC
  Administered 2014-04-09 – 2014-04-10 (×4): 15 mL via OROMUCOSAL

## 2014-04-09 MED ORDER — VASOPRESSIN 20 UNIT/ML IJ SOLN
0.5000 [IU]/h | INTRAVENOUS | Status: DC
  Administered 2014-04-09: 0.5 [IU]/h via INTRAVENOUS
  Filled 2014-04-09: qty 1

## 2014-04-09 MED ORDER — DEXTROSE 5 % IV SOLN
30.0000 ug/min | INTRAVENOUS | Status: DC
  Administered 2014-04-09: 30 ug/min via INTRAVENOUS
  Filled 2014-04-09: qty 4

## 2014-04-09 MED ORDER — DEXTROSE 50 % IV SOLN
INTRAVENOUS | Status: AC
  Filled 2014-04-09: qty 50

## 2014-04-09 MED ORDER — ALBUMIN HUMAN 5 % IV SOLN
12.5000 g | Freq: Once | INTRAVENOUS | Status: AC
  Administered 2014-04-09: 12.5 g via INTRAVENOUS
  Filled 2014-04-09: qty 250

## 2014-04-09 MED ORDER — METHYLPREDNISOLONE SODIUM SUCC 1000 MG IJ SOLR
1000.0000 mg | INTRAMUSCULAR | Status: DC
  Filled 2014-04-09: qty 8

## 2014-04-09 MED ORDER — TECHNETIUM TC 99M EXAMETAZIME IV KIT
20.0000 | PACK | Freq: Once | INTRAVENOUS | Status: AC | PRN
  Administered 2014-04-09: 20 via INTRAVENOUS

## 2014-04-09 MED ORDER — SODIUM CHLORIDE 0.9 % IV SOLN
0.0300 [IU]/min | INTRAVENOUS | Status: DC
  Administered 2014-04-09: 0.03 [IU]/min via INTRAVENOUS
  Filled 2014-04-09: qty 2.5

## 2014-04-09 MED ORDER — INSULIN ASPART 100 UNIT/ML ~~LOC~~ SOLN
20.0000 [IU] | Freq: Once | SUBCUTANEOUS | Status: AC
  Administered 2014-04-09: 20 [IU] via SUBCUTANEOUS

## 2014-04-09 MED ORDER — SODIUM CHLORIDE 0.9 % IV SOLN
1000.0000 mg | INTRAVENOUS | Status: AC
  Administered 2014-04-09: 1000 mg via INTRAVENOUS
  Filled 2014-04-09: qty 8

## 2014-04-09 MED ORDER — SODIUM CHLORIDE 0.9 % IV SOLN
2.0000 ug/min | INTRAVENOUS | Status: DC
  Administered 2014-04-09: 40 ug/min via INTRAVENOUS
  Administered 2014-04-10: 8 ug/min via INTRAVENOUS
  Filled 2014-04-09 (×3): qty 16

## 2014-04-09 MED ORDER — PHENYLEPHRINE HCL 10 MG/ML IJ SOLN
30.0000 ug/min | INTRAVENOUS | Status: DC
  Administered 2014-04-09: 30 ug/min via INTRAVENOUS
  Administered 2014-04-09: 125 ug/min via INTRAVENOUS
  Administered 2014-04-09: 56.667 ug/min via INTRAVENOUS
  Filled 2014-04-09 (×3): qty 1

## 2014-04-09 MED ORDER — SODIUM CHLORIDE 0.9 % IV SOLN
INTRAVENOUS | Status: DC
  Administered 2014-04-09: 50 mL/h via INTRAVENOUS
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
## 2014-04-09 NOTE — Progress Notes (Signed)
Nutrition Brief Note  Chart reviewed. [facility], per MD exam consistent with brain death. Brain flow study pending. No further nutrition interventions warranted at this time.  Please re-consult as needed.   Kendell BaneHeather Phinehas Grounds RD, LDN, CNSC [phone] Pager [phone] After Hours Pager

## 2014-04-09 NOTE — ED Notes (Signed)
[facility] notified. Spoke with Jonathan Stewart. Coordinator Jonathan Stewart will return call. Referral number [id number]
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
## 2014-04-09 NOTE — ED Notes (Signed)
Pt transported to CT with Primary RN, Nolon RodKelly D. And La FolletteAmanda NT

## 2014-04-09 NOTE — ED Notes (Signed)
Family at bedside. 

## 2014-04-09 NOTE — Progress Notes (Signed)
Chaplain made follow up visit with family, escorting them to waiting area of 46M. Chaplain advised staff of family's location in waiting area and asked that they notify family when pt is ready for visitors. Chaplain offered prayer and emotional support to parents.

## 2014-04-09 NOTE — Progress Notes (Signed)
Patient ID: Jonathan Stewart, male   DOB: 03/18/1986, 28 y.o.   MRN: [id number] I spoke with his mother and other family at the bedside earlier this AM. Jonathan GelinasBurke [name], MD, MPH, FACS Trauma: [phone] General Surgery: [phone]
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
## 2014-04-09 NOTE — Progress Notes (Addendum)
Patient ID: Jonathan Stewart, male   DOB: April 15, 1986, 28 y.o.   MRN: [id number] Follow up - Trauma Critical Care  Patient Details:    Jonathan Stewart is an 28 y.o. male.  Lines/tubes : Airway 8 mm (Active)  Secured at (cm) 27 cm 04/09/2014  7:33 AM  Measured From Lips 04/09/2014  7:33 AM  Secured Location Right 04/09/2014  7:33 AM  Secured By [employer] 04/09/2014  7:33 AM  Tube Holder Repositioned Yes 04/09/2014  7:33 AM  Site Condition Dry 04/09/2014  1:40 AM     Chest Tube Left;Lateral Pleural (Active)  Suction -20 cm H2O 04/09/2014  6:00 AM  Chest Tube Air Leak None 04/09/2014  6:00 AM  Patency Intervention Milked;Tip/tilt 04/09/2014  6:00 AM  Drainage Description Bright red 04/09/2014  6:00 AM  Dressing Status Clean;Dry;Intact 04/09/2014  6:00 AM  Site Assessment Clean;Dry;Intact 04/09/2014  6:00 AM  Surrounding Skin Dry;Intact 04/09/2014  6:00 AM  Output (mL) 80 mL 04/09/2014  6:00 AM     Urethral Catheter Derrell, EMT  Temperature probe 16 Fr. (Active)  Indication for Insertion or Continuance of Catheter Unstable critical patients (first 24-48 hours) 04/09/2014  1:40 AM  Site Assessment Clean;Intact 04/09/2014  1:40 AM  Catheter Maintenance Bag below level of bladder;Catheter secured;Drainage bag/tubing not touching floor;No dependent loops;Seal intact;Insertion date on drainage bag 04/09/2014  1:40 AM  Collection Container Standard drainage bag 04/09/2014  1:40 AM  Securement Method Leg strap 04/09/2014  1:40 AM  Urinary Catheter Interventions Unclamped 04/09/2014  1:40 AM    Microbiology/Sepsis markers: Results for orders placed during the hospital encounter of 20-Apr-2014  MRSA PCR SCREENING     Status: None   Collection Time    04/09/14  1:45 AM      Result Value Ref Range Status   MRSA by PCR NEGATIVE  NEGATIVE Final   Comment:            The GeneXpert MRSA Assay (FDA     approved for NASAL specimens     only), is one component of a     comprehensive MRSA colonization      surveillance program. It is not     intended to diagnose MRSA     infection nor to guide or     monitor treatment for     MRSA infections.    Anti-infectives:  Anti-infectives   None      Best Practice/Protocols:  VTE Prophylaxis: Mechanical no sedation  Consults: Treatment Team:  Carmela Hurt, MD    Studies:    Events:  Subjective:    Overnight Issues:   Objective:  Vital signs for last 24 hours: Temp:  [97.4 F (36.3 C)-98.6 F (37 C)] 98.6 F (37 C) (07/27 0806) Pulse Rate:  [115-163] 128 (07/27 0830) Resp:  [12-20] 12 (07/27 0830) BP: (51-200)/(24-122) 114/57 mmHg (07/27 0830) SpO2:  [97 %-100 %] 99 % (07/27 0830) FiO2 (%):  [50 %-100 %] 50 % (07/27 0800) Weight:  [174 lb 6.1 oz (79.1 kg)-180 lb (81.647 kg)] 174 lb 6.1 oz (79.1 kg) (07/27 0140)  Hemodynamic parameters for last 24 hours:    Intake/Output from previous day: 07/26 0701 - 07/27 0700 In: 3946.3 [I.V.:3946.3] Out: 3310 [Urine:3230; Chest Tube:80]  Intake/Output this shift: Total I/O In: 282.5 [I.V.:282.5] Out: -   Vent settings for last 24 hours: Vent Mode:  [-] PRVC FiO2 (%):  [50 %-100 %] 50 % Set Rate:  [12 bmp-18 bmp] 12 bmp  Vt Set:  [550 mL-580 mL] 550 mL PEEP:  [5 cmH20] 5 cmH20 Plateau Pressure:  [14 cmH20] 14 cmH20  Physical Exam:  General: on vent Neuro: pupils fixed and dilated, no gag, no corneal, no movement to pain, no spont resp HEENT/Neck: ETT and collar Resp: clear to auscultation bilaterally CVS: RRR GI: soft, ND, quiet Extremities: claves soft  Results for orders placed during the hospital encounter of 03/27/2014 (from the past 24 hour(s))  PREPARE FRESH FROZEN PLASMA     Status: None   Collection Time    04/06/2014 10:06 PM      Result Value Ref Range   Unit Number [id number]     Blood Component Type THAWED PLASMA     Unit division 00     [facility]     Unit tag comment VERBAL ORDERS PER DR DELO     Transfusion Status OK TO  TRANSFUSE     Unit Number [id number]     Blood Component Type THAWED PLASMA     Unit division 00     [facility] comment VERBAL ORDERS PER DR DELO     Transfusion Status OK TO TRANSFUSE    TYPE AND SCREEN     Status: None   Collection Time    03/14/2014 10:34 PM      Result Value Ref Range   ABO/RH(D) A NEG     Antibody Screen NEG     Sample Expiration 03/17/2014     Unit Number [id number]     Blood Component Type RED CELLS,LR     Unit division 00     [facility]     Unit tag comment VERBAL ORDERS PER DR DELO     Transfusion Status OK TO TRANSFUSE     Crossmatch Result COMPATIBLE     Unit Number [id number]     Blood Component Type RED CELLS,LR     Unit division 00     [facility]     Unit tag comment VERBAL ORDERS PER DR DELO     Transfusion Status OK TO TRANSFUSE     Crossmatch Result COMPATIBLE    ABO/RH     Status: None   Collection Time    03/22/2014 10:34 PM      Result Value Ref Range   ABO/RH(D) A NEG    I-STAT TROPOININ, ED     Status: None   Collection Time    04/12/2014 10:40 PM      Result Value Ref Range   Troponin i, poc 0.00  0.00 - 0.08 ng/mL   Comment 3           CDS SEROLOGY     Status: None   Collection Time    04/02/2014 10:41 PM      Result Value Ref Range   CDS serology specimen       Value: SPECIMEN WILL BE HELD FOR 14 DAYS IF TESTING IS REQUIRED  COMPREHENSIVE METABOLIC PANEL     Status: Abnormal   Collection Time    03/27/2014 10:41 PM      Result Value Ref Range   Sodium 140  137 - 147 mEq/L   Potassium 3.1 (*) 3.7 - 5.3 mEq/L   Chloride 102  96 - 112 mEq/L   CO2 18 (*) 19 - 32 mEq/L   Glucose, Bld 208 (*) 70 -  99 mg/dL   BUN 15  6 - 23 mg/dL   Creatinine, Ser 1.61  0.50 - 1.35 mg/dL   Calcium 8.1 (*) 8.4 - 10.5 mg/dL   Total Protein 7.3  6.0 - 8.3 g/dL   Albumin 3.8  3.5 - 5.2 g/dL   AST 096 (*) 0 - 37 U/L   ALT 118 (*) 0 - 53 U/L   Alkaline Phosphatase 108  39 -  117 U/L   Total Bilirubin 0.2 (*) 0.3 - 1.2 mg/dL   GFR calc non Af Amer 81 (*) >90 mL/min   GFR calc Af Amer >90  >90 mL/min   Anion gap 20 (*) 5 - 15  CBC     Status: Abnormal   Collection Time    04/09/2014 10:41 PM      Result Value Ref Range   WBC 19.1 (*) 4.0 - 10.5 K/uL   RBC 4.80  4.22 - 5.81 MIL/uL   Hemoglobin 13.9  13.0 - 17.0 g/dL   HCT 04.5  40.9 - 81.1 %   MCV 86.0  78.0 - 100.0 fL   MCH 29.0  26.0 - 34.0 pg   MCHC 33.7  30.0 - 36.0 g/dL   RDW 91.4  78.2 - 95.6 %   Platelets 248  150 - 400 K/uL  ETHANOL     Status: None   Collection Time    03/23/2014 10:41 PM      Result Value Ref Range   Alcohol, Ethyl (B) <11  0 - 11 mg/dL  PROTIME-INR     Status: Abnormal   Collection Time    04/05/2014 10:41 PM      Result Value Ref Range   Prothrombin Time 16.3 (*) 11.6 - 15.2 seconds   INR 1.31  0.00 - 1.49  I-STAT CHEM 8, ED     Status: Abnormal   Collection Time    03/21/2014 10:42 PM      Result Value Ref Range   Sodium 138  137 - 147 mEq/L   Potassium 2.8 (*) 3.7 - 5.3 mEq/L   Chloride 107  96 - 112 mEq/L   BUN 15  6 - 23 mg/dL   Creatinine, Ser 2.13 (*) 0.50 - 1.35 mg/dL   Glucose, Bld 086 (*) 70 - 99 mg/dL   Calcium, Ion 5.78 (*) 1.12 - 1.23 mmol/L   TCO2 19  0 - 100 mmol/L   Hemoglobin 14.6  13.0 - 17.0 g/dL   HCT 46.9  62.9 - 52.8 %   Comment NOTIFIED PHYSICIAN    I-STAT CG4 LACTIC ACID, ED     Status: Abnormal   Collection Time    04/13/2014 10:42 PM      Result Value Ref Range   Lactic Acid, Venous 4.91 (*) 0.5 - 2.2 mmol/L  URINALYSIS, ROUTINE W REFLEX MICROSCOPIC     Status: Abnormal   Collection Time    03/25/2014 10:45 PM      Result Value Ref Range   Color, Urine YELLOW  YELLOW   APPearance CLEAR  CLEAR   Specific Gravity, Urine 1.014  1.005 - 1.030   pH 6.5  5.0 - 8.0   Glucose, UA 250 (*) NEGATIVE mg/dL   Hgb urine dipstick LARGE (*) NEGATIVE   Bilirubin Urine NEGATIVE  NEGATIVE   Ketones, ur NEGATIVE  NEGATIVE mg/dL   Protein, ur 30 (*) NEGATIVE  mg/dL   Urobilinogen, UA 0.2  0.0 - 1.0 mg/dL   Nitrite NEGATIVE  NEGATIVE  Leukocytes, UA NEGATIVE  NEGATIVE  URINE MICROSCOPIC-ADD ON     Status: Abnormal   Collection Time    03/14/2014 10:45 PM      Result Value Ref Range   Squamous Epithelial / LPF RARE  RARE   RBC / HPF 3-6  <3 RBC/hpf   Bacteria, UA RARE  RARE   Casts GRANULAR CAST (*) NEGATIVE  MRSA PCR SCREENING     Status: None   Collection Time    04/09/14  1:45 AM      Result Value Ref Range   MRSA by PCR NEGATIVE  NEGATIVE    Assessment & Plan: Present on Admission:  . 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 . [facility] TBI/SAH - exam C/W brain death, check nuc med flow study Atlanto occipital separation with FX - includes canal compromise, collar CV - neurogenic shock, levophed and neo to support VDRF - full support, check ABG FEN - hypokalemia - replace K VTE - PAS DIspo - as above. Mother has requested organ donation. CDS on unit. Await brain flow study. Critical Care Total Time*: 44 Minutes  Violeta Gelinas, MD, MPH, [facility] Trauma: [phone] General Surgery: [phone]  04/09/2014  *Care during the described time interval was provided by me. I have reviewed this patient's available data, including medical history, events of note, physical examination and test results as part of my evaluation.

## 2014-04-09 NOTE — ED Notes (Signed)
Family updated as to patient's status. Md Delo, chaplain and primary RN Tresa EndoKelly in family consult room.
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
## 2014-04-09 NOTE — Procedures (Signed)
Arterial Catheter Insertion Procedure Note Rick Dufficolas R Edmunds [id number] 11/13/1985  Procedure: Insertion of Arterial Catheter  Indications: Blood pressure monitoring  Procedure Details Consent: Risks of procedure as well as the alternatives and risks of each were explained to the (patient/caregiver).  Consent for procedure obtained. Time Out: Verified patient identification, verified procedure, site/side was marked, verified correct patient position, special equipment/implants available, medications/allergies/relevent history reviewed, required imaging and test results available.  Performed  Maximum sterile technique was used including antiseptics, cap, gloves, gown, hand hygiene, mask and sheet. Skin prep: Chlorhexidine; local anesthetic administered Femoral catheter was inserted into right femoral artery using the Seldinger technique.  Evaluation Blood flow good; BP tracing good. Complications: No apparent complications.   Joffre Lucks E 04/09/2014

## 2014-04-09 NOTE — Progress Notes (Signed)
Patient ID: Jonathan Stewart R Wiltrout, male   DOB: 12/12/1985, 28 y.o.   MRN: [id number] NM flow study shows no brain flow. 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 I spoke with his family at the bedside. CDS present. [name] Berry Gallacher, MD, MPH, FACS Trauma: [phone] General Surgery: [phone]

## 2014-04-09 NOTE — Procedures (Signed)
Central Venous Catheter Insertion Procedure Note Jonathan Stewart [id number] 03/18/1986  Procedure: Insertion of Central Venous Catheter Indications: Drug and/or fluid administration  Procedure Details Consent: Risks of procedure as well as the alternatives and risks of each were explained to the (patient/caregiver).  Consent for procedure obtained. Time Out: Verified patient identification, verified procedure, site/side was marked, verified correct patient position, special equipment/implants available, medications/allergies/relevent history reviewed, required imaging and test results available.  Performed  Maximum sterile technique was used including antiseptics, cap, gloves, gown, hand hygiene, mask and sheet. Skin prep: Chlorhexidine; local anesthetic administered A antimicrobial bonded/coated triple lumen catheter was placed in the L femoral vein using the Seldinger technique after multiple failed attempts to access the L subclavian vein.  Evaluation Blood flow good Complications: No apparent complications Patient did tolerate procedure well. Chest X-ray ordered to R/C complication. Already has CT on L.  Jonathan Stewart E 04/09/2014, 12:06 PM

## 2014-04-09 NOTE — ED Notes (Signed)
Report given to Josh, RN.

## 2014-04-09 NOTE — Clinical Social Work Note (Signed)
Clinical Social Worker present at bedside when patient family notified of NM flow study results.  Patient family appropriately grieving.  Patient mother has already expressed interest in [facility] and they are already involved.  RN spoke with patient father regarding clay handprints - patient father in agreement and handprints completed for family.  CSW remains available for support to patient family as needed.  Macario GoldsJesse Krishav Mamone, Kentucky[place]CS[place] [phone]

## 2014-04-09 NOTE — ED Notes (Signed)
Attending RN at patient bedside throughout patient stay in the ED.

## 2014-04-10 ENCOUNTER — Inpatient Hospital Stay (HOSPITAL_COMMUNITY): Payer: BC Managed Care – PPO

## 2014-04-10 ENCOUNTER — Encounter (HOSPITAL_COMMUNITY): Payer: Self-pay | Admitting: Anesthesiology

## 2014-04-10 ENCOUNTER — Encounter (HOSPITAL_COMMUNITY): Admission: EM | Disposition: E | Payer: Self-pay | Source: Home / Self Care

## 2014-04-10 DIAGNOSIS — T794XXA Traumatic shock, initial encounter: Secondary | ICD-10-CM | POA: Diagnosis not present

## 2014-04-10 DIAGNOSIS — IMO0002 Reserved for concepts with insufficient information to code with codable children: Secondary | ICD-10-CM | POA: Diagnosis not present

## 2014-04-10 DIAGNOSIS — I319 Disease of pericardium, unspecified: Secondary | ICD-10-CM

## 2014-04-10 DIAGNOSIS — J9600 Acute respiratory failure, unspecified whether with hypoxia or hypercapnia: Secondary | ICD-10-CM | POA: Diagnosis not present

## 2014-04-10 DIAGNOSIS — S066X9A Traumatic subarachnoid hemorrhage with loss of consciousness of unspecified duration, initial encounter: Secondary | ICD-10-CM | POA: Diagnosis not present

## 2014-04-10 HISTORY — PX: ORGAN PROCUREMENT: SHX5270

## 2014-04-10 LAB — CK TOTAL AND CKMB (NOT AT ARMC)
CK, MB: 15.7 ng/mL (ref 0.3–4.0)
RELATIVE INDEX: 0.5 (ref 0.0–2.5)
Total CK: 3220 U/L — ABNORMAL HIGH (ref 7–232)

## 2014-04-10 LAB — CBC WITH DIFFERENTIAL/PLATELET
BASOS ABS: 0 10*3/uL (ref 0.0–0.1)
BASOS PCT: 0 % (ref 0–1)
Basophils Absolute: 0 10*3/uL (ref 0.0–0.1)
Basophils Relative: 0 % (ref 0–1)
EOS ABS: 0 10*3/uL (ref 0.0–0.7)
EOS ABS: 0 10*3/uL (ref 0.0–0.7)
EOS PCT: 0 % (ref 0–5)
Eosinophils Relative: 0 % (ref 0–5)
HCT: 20.6 % — ABNORMAL LOW (ref 39.0–52.0)
HCT: 24.5 % — ABNORMAL LOW (ref 39.0–52.0)
HEMATOCRIT: 18.8 % — AB (ref 39.0–52.0)
Hemoglobin: 7.3 g/dL — ABNORMAL LOW (ref 13.0–17.0)
Hemoglobin: 6.6 g/dL — CL (ref 13.0–17.0)
Hemoglobin: 8.4 g/dL — ABNORMAL LOW (ref 13.0–17.0)
Lymphocytes Relative: 9 % — ABNORMAL LOW (ref 12–46)
Lymphocytes Relative: 9 % — ABNORMAL LOW (ref 12–46)
Lymphs Abs: 1.2 10*3/uL (ref 0.7–4.0)
Lymphs Abs: 1.2 10*3/uL (ref 0.7–4.0)
MCH: 29.3 pg (ref 26.0–34.0)
MCH: 29.3 pg (ref 26.0–34.0)
MCH: 30 pg (ref 26.0–34.0)
MCHC: 35.4 g/dL (ref 30.0–36.0)
MCHC: 35.1 g/dL (ref 30.0–36.0)
MCHC: 34.3 g/dL (ref 30.0–36.0)
MCV: 82.7 fL (ref 78.0–100.0)
MCV: 83.6 fL (ref 78.0–100.0)
MCV: 87.5 fL (ref 78.0–100.0)
MONO ABS: 1.2 10*3/uL — AB (ref 0.1–1.0)
Monocytes Absolute: 0.8 10*3/uL (ref 0.1–1.0)
Monocytes Relative: 6 % (ref 3–12)
Monocytes Relative: 9 % (ref 3–12)
NEUTROS PCT: 85 % — AB (ref 43–77)
NEUTROS PCT: 82 % — AB (ref 43–77)
Neutro Abs: 11.5 10*3/uL — ABNORMAL HIGH (ref 1.7–7.7)
Neutro Abs: 10.9 10*3/uL — ABNORMAL HIGH (ref 1.7–7.7)
PLATELETS: 121 10*3/uL — AB (ref 150–400)
PLATELETS: 119 10*3/uL — AB (ref 150–400)
Platelets: 109 10*3/uL — ABNORMAL LOW (ref 150–400)
RBC: 2.49 MIL/uL — ABNORMAL LOW (ref 4.22–5.81)
RBC: 2.25 MIL/uL — AB (ref 4.22–5.81)
RBC: 2.8 MIL/uL — AB (ref 4.22–5.81)
RDW: 12.8 % (ref 11.5–15.5)
RDW: 13 % (ref 11.5–15.5)
RDW: 13.7 % (ref 11.5–15.5)
WBC: 13.6 10*3/uL — AB (ref 4.0–10.5)
WBC: 13.2 10*3/uL — ABNORMAL HIGH (ref 4.0–10.5)
WBC: 12 10*3/uL — ABNORMAL HIGH (ref 4.0–10.5)

## 2014-04-10 LAB — COMPREHENSIVE METABOLIC PANEL
ALBUMIN: 2.9 g/dL — AB (ref 3.5–5.2)
ALK PHOS: 58 U/L (ref 39–117)
ALK PHOS: 61 U/L (ref 39–117)
ALT: 56 U/L — ABNORMAL HIGH (ref 0–53)
ALT: 50 U/L (ref 0–53)
ALT: 48 U/L (ref 0–53)
ANION GAP: 9 (ref 5–15)
ANION GAP: 9 (ref 5–15)
AST: 57 U/L — ABNORMAL HIGH (ref 0–37)
AST: 49 U/L — ABNORMAL HIGH (ref 0–37)
AST: 46 U/L — ABNORMAL HIGH (ref 0–37)
Albumin: 2.8 g/dL — ABNORMAL LOW (ref 3.5–5.2)
Albumin: 2.9 g/dL — ABNORMAL LOW (ref 3.5–5.2)
Alkaline Phosphatase: 57 U/L (ref 39–117)
Anion gap: 10 (ref 5–15)
BILIRUBIN TOTAL: 0.4 mg/dL (ref 0.3–1.2)
BILIRUBIN TOTAL: 1 mg/dL (ref 0.3–1.2)
BUN: 10 mg/dL (ref 6–23)
BUN: 10 mg/dL (ref 6–23)
BUN: 9 mg/dL (ref 6–23)
CALCIUM: 8.1 mg/dL — AB (ref 8.4–10.5)
CHLORIDE: 109 meq/L (ref 96–112)
CHLORIDE: 114 meq/L — AB (ref 96–112)
CO2: 25 meq/L (ref 19–32)
CO2: 26 mEq/L (ref 19–32)
CO2: 27 mEq/L (ref 19–32)
CREATININE: 1.25 mg/dL (ref 0.50–1.35)
CREATININE: 1.12 mg/dL (ref 0.50–1.35)
Calcium: 7.7 mg/dL — ABNORMAL LOW (ref 8.4–10.5)
Calcium: 8.5 mg/dL (ref 8.4–10.5)
Chloride: 110 mEq/L (ref 96–112)
Creatinine, Ser: 1.11 mg/dL (ref 0.50–1.35)
GFR calc Af Amer: >90 mL/min (ref >90)
GFR calc Af Amer: >90 mL/min (ref >90)
GFR calc Af Amer: >90 mL/min (ref >90)
GFR calc non Af Amer: >90 mL/min (ref >90)
GFR, EST NON AFRICAN AMERICAN: 78 mL/min — AB (ref >90)
GFR, EST NON AFRICAN AMERICAN: 89 mL/min — AB (ref >90)
GLUCOSE: 197 mg/dL — AB (ref 70–99)
Glucose, Bld: 159 mg/dL — ABNORMAL HIGH (ref 70–99)
Glucose, Bld: 186 mg/dL — ABNORMAL HIGH (ref 70–99)
POTASSIUM: 4.7 meq/L (ref 3.7–5.3)
Potassium: 4.8 mEq/L (ref 3.7–5.3)
Potassium: 5.1 mEq/L (ref 3.7–5.3)
SODIUM: 146 meq/L (ref 137–147)
SODIUM: 150 meq/L — AB (ref 137–147)
Sodium: 143 mEq/L (ref 137–147)
TOTAL PROTEIN: 5.2 g/dL — AB (ref 6.0–8.3)
Total Bilirubin: 0.3 mg/dL (ref 0.3–1.2)
Total Protein: 5.3 g/dL — ABNORMAL LOW (ref 6.0–8.3)
Total Protein: 5.4 g/dL — ABNORMAL LOW (ref 6.0–8.3)

## 2014-04-10 LAB — URINALYSIS, ROUTINE W REFLEX MICROSCOPIC
BILIRUBIN URINE: NEGATIVE
GLUCOSE, UA: NEGATIVE mg/dL
HGB URINE DIPSTICK: NEGATIVE
KETONES UR: NEGATIVE mg/dL
LEUKOCYTES UA: NEGATIVE
Nitrite: NEGATIVE
PH: 5.5 (ref 5.0–8.0)
PROTEIN: NEGATIVE mg/dL
Specific Gravity, Urine: 1.009 (ref 1.005–1.030)
Urobilinogen, UA: 0.2 mg/dL (ref 0.0–1.0)

## 2014-04-10 LAB — TROPONIN I: Troponin I: <0.3 ng/mL (ref <0.30)

## 2014-04-10 LAB — POCT I-STAT 3, ART BLOOD GAS (G3+)
ACID-BASE EXCESS: 3 mmol/L — AB (ref 0.0–2.0)
ACID-BASE EXCESS: 4 mmol/L — AB (ref 0.0–2.0)
BICARBONATE: 29.1 meq/L — AB (ref 20.0–24.0)
Bicarbonate: 29.3 mEq/L — ABNORMAL HIGH (ref 20.0–24.0)
O2 Saturation: 100 %
O2 Saturation: 100 %
PH ART: 7.353 (ref 7.350–7.450)
TCO2: 31 mmol/L (ref 0–100)
TCO2: 31 mmol/L (ref 0–100)
pCO2 arterial: 49.6 mmHg — ABNORMAL HIGH (ref 35.0–45.0)
pCO2 arterial: 52.7 mmHg — ABNORMAL HIGH (ref 35.0–45.0)
pH, Arterial: 7.38 (ref 7.350–7.450)
pO2, Arterial: 181 mmHg — ABNORMAL HIGH (ref 80.0–100.0)
pO2, Arterial: 516 mmHg — ABNORMAL HIGH (ref 80.0–100.0)

## 2014-04-10 LAB — URINE CULTURE
CULTURE: NO GROWTH
Colony Count: NO GROWTH

## 2014-04-10 LAB — LACTIC ACID, PLASMA: LACTIC ACID, VENOUS: 1 mmol/L (ref 0.5–2.2)

## 2014-04-10 SURGERY — SURGICAL PROCUREMENT, ORGAN
Anesthesia: General | Site: Abdomen

## 2014-04-10 MED ORDER — STERILE WATER FOR INJECTION IV SOLN
50.0000 mg | Freq: Once | INTRAVENOUS | Status: DC
  Filled 2014-04-10: qty 50

## 2014-04-10 MED ORDER — HEPARIN SODIUM (PORCINE) 1000 UNIT/ML IJ SOLN
23730.0000 [IU] | Freq: Once | INTRAMUSCULAR | Status: DC
  Filled 2014-04-10: qty 23.75

## 2014-04-10 MED ORDER — VANCOMYCIN HCL 10 G IV SOLR
1500.0000 mg | Freq: Three times a day (TID) | INTRAVENOUS | Status: AC
  Administered 2014-04-10: 1500 mg via INTRAVENOUS
  Filled 2014-04-10 (×2): qty 1500

## 2014-04-10 MED ORDER — ALBUMIN HUMAN 5 % IV SOLN
12.5000 g | Freq: Once | INTRAVENOUS | Status: AC
  Administered 2014-04-10: 12.5 g via INTRAVENOUS
  Filled 2014-04-10 (×2): qty 250

## 2014-04-10 MED ORDER — CEFAZOLIN SODIUM-DEXTROSE 2-3 GM-% IV SOLR
2.0000 g | Freq: Three times a day (TID) | INTRAVENOUS | Status: AC
  Administered 2014-04-10: 2 g via INTRAVENOUS
  Filled 2014-04-10: qty 50

## 2014-04-10 MED ORDER — VECURONIUM BROMIDE 10 MG IV SOLR
10.0000 mg | INTRAVENOUS | Status: AC
  Filled 2014-04-10: qty 10

## 2014-04-10 MED ORDER — SODIUM CHLORIDE 0.9 % IV SOLN
1.0000 g | Freq: Once | INTRAVENOUS | Status: AC
  Administered 2014-04-10: 1 g via INTRAVENOUS
  Filled 2014-04-10: qty 10

## 2014-04-10 SURGICAL SUPPLY — 82 items
BLADE 10 SAFETY STRL DISP (BLADE) ×3 IMPLANT
BLADE STERNUM SYSTEM 6 (BLADE) ×3 IMPLANT
BLADE SURG 10 STRL SS (BLADE) ×6 IMPLANT
BLADE SURG ROTATE 9660 (MISCELLANEOUS) IMPLANT
CANNULA VESSEL W/WING WO/VALVE (CANNULA) IMPLANT
CLIP TI MEDIUM 24 (CLIP) IMPLANT
CLIP TI WIDE RED SMALL 24 (CLIP) IMPLANT
CONT SPEC 4OZ CLIKSEAL STRL BL (MISCELLANEOUS) ×9 IMPLANT
CONT SPEC STER OR (MISCELLANEOUS) ×12 IMPLANT
COVER MAYO STAND STRL (DRAPES) ×6 IMPLANT
COVER SURGICAL LIGHT HANDLE (MISCELLANEOUS) ×6 IMPLANT
COVER TABLE BACK 60X90 (DRAPES) IMPLANT
DRAPE PROXIMA HALF (DRAPES) IMPLANT
DRAPE SLUSH MACHINE 52X66 (DRAPES) ×3 IMPLANT
DRAPE SLUSH/WARMER DISC (DRAPES) ×3 IMPLANT
DRESSING TELFA 8X3 (GAUZE/BANDAGES/DRESSINGS) ×3 IMPLANT
DRSG COVADERM 4X10 (GAUZE/BANDAGES/DRESSINGS) ×12 IMPLANT
DRSG TELFA 3X8 NADH (GAUZE/BANDAGES/DRESSINGS) ×3 IMPLANT
DURAPREP 26ML APPLICATOR (WOUND CARE) ×6 IMPLANT
ELECT BLADE 6.5 EXT (BLADE) ×3 IMPLANT
ELECT REM PT RETURN 9FT ADLT (ELECTROSURGICAL) ×6
ELECTRODE REM PT RTRN 9FT ADLT (ELECTROSURGICAL) ×2 IMPLANT
GAUZE SPONGE 4X4 16PLY XRAY LF (GAUZE/BANDAGES/DRESSINGS) IMPLANT
GLOVE BIO SURGEON STRL SZ 6 (GLOVE) ×3 IMPLANT
GLOVE BIO SURGEON STRL SZ7 (GLOVE) ×6 IMPLANT
GLOVE BIO SURGEON STRL SZ7.5 (GLOVE) ×9 IMPLANT
GLOVE BIO SURGEON STRL SZ8 (GLOVE) ×3 IMPLANT
GLOVE BIOGEL PI IND STRL 7.0 (GLOVE) ×2 IMPLANT
GLOVE BIOGEL PI IND STRL 7.5 (GLOVE) ×1 IMPLANT
GLOVE BIOGEL PI INDICATOR 7.0 (GLOVE) ×4
GLOVE BIOGEL PI INDICATOR 7.5 (GLOVE) ×2
GLOVE ECLIPSE 7.0 STRL STRAW (GLOVE) ×6 IMPLANT
GLOVE SS BIOGEL STRL SZ 7 (GLOVE) ×3 IMPLANT
GLOVE SUPERSENSE BIOGEL SZ 7 (GLOVE) ×6
GLOVE SURG SS PI 6.5 STRL IVOR (GLOVE) ×3 IMPLANT
GLOVE SURG SS PI 7.5 STRL IVOR (GLOVE) ×6 IMPLANT
GOWN STRL REUS W/ TWL LRG LVL3 (GOWN DISPOSABLE) ×4 IMPLANT
GOWN STRL REUS W/TWL LRG LVL3 (GOWN DISPOSABLE) ×8
KIT POST MORTEM ADULT 36X90 (BAG) ×3 IMPLANT
KIT ROOM TURNOVER OR (KITS) ×3 IMPLANT
LIGASURE IMPACT 36 18CM CVD LR (INSTRUMENTS) ×3 IMPLANT
LOOP VESSEL MAXI BLUE (MISCELLANEOUS) ×3 IMPLANT
LOOP VESSEL MINI RED (MISCELLANEOUS) ×3 IMPLANT
MANIFOLD NEPTUNE II (INSTRUMENTS) IMPLANT
NEEDLE BIOPSY 14X6 SOFT TISS (NEEDLE) ×3 IMPLANT
NS IRRIG 1000ML POUR BTL (IV SOLUTION) IMPLANT
PACK AORTA (CUSTOM PROCEDURE TRAY) ×3 IMPLANT
PAD ARMBOARD 7.5X6 YLW CONV (MISCELLANEOUS) ×6 IMPLANT
PENCIL BUTTON HOLSTER BLD 10FT (ELECTRODE) ×6 IMPLANT
RELOAD WHITE ECR60W (STAPLE) ×6 IMPLANT
SOLUTION BETADINE 4OZ (MISCELLANEOUS) ×3 IMPLANT
SPONGE INTESTINAL PEANUT (DISPOSABLE) ×6 IMPLANT
SPONGE LAP 18X18 X RAY DECT (DISPOSABLE) IMPLANT
STAPLER STANDARD HANDLE (STAPLE) ×6 IMPLANT
STAPLER VISISTAT 35W (STAPLE) ×6 IMPLANT
SUCTION POOLE TIP (SUCTIONS) ×15 IMPLANT
SUT BONE WAX W31G (SUTURE) ×3 IMPLANT
SUT ETHIBOND 5 LR DA (SUTURE) IMPLANT
SUT ETHILON 1 LR 30 (SUTURE) ×12 IMPLANT
SUT ETHILON 2 LR (SUTURE) ×3 IMPLANT
SUT PROLENE 6 0 BV (SUTURE) IMPLANT
SUT PROLENE 6 0 C 1 24 (SUTURE) ×6 IMPLANT
SUT SILK 0 TIES 10X30 (SUTURE) ×3 IMPLANT
SUT SILK 1 SH (SUTURE) IMPLANT
SUT SILK 1 TIES 10X30 (SUTURE) IMPLANT
SUT SILK 2 0 (SUTURE)
SUT SILK 2 0 SH (SUTURE) IMPLANT
SUT SILK 2 0 SH CR/8 (SUTURE) ×3 IMPLANT
SUT SILK 2 0 TIES 10X30 (SUTURE) ×3 IMPLANT
SUT SILK 2-0 18XBRD TIE 12 (SUTURE) IMPLANT
SUT SILK 3 0 TIES 10X30 (SUTURE) IMPLANT
SUT SILK 4 0 CR 8 RB 1 (SUTURE) ×3 IMPLANT
SWAB COLLECTION DEVICE MRSA (MISCELLANEOUS) IMPLANT
SYRINGE TOOMEY DISP (SYRINGE) ×3 IMPLANT
TAPE UMBILICAL 1/8 X36 TWILL (MISCELLANEOUS) ×6 IMPLANT
TOWEL OR 17X24 6PK STRL BLUE (TOWEL DISPOSABLE) ×3 IMPLANT
TOWEL OR 17X26 10 PK STRL BLUE (TOWEL DISPOSABLE) ×6 IMPLANT
TUBE ANAEROBIC SPECIMEN COL (MISCELLANEOUS) IMPLANT
TUBE CONNECTING 12'X1/4 (SUCTIONS) ×2
TUBE CONNECTING 12X1/4 (SUCTIONS) ×4 IMPLANT
WATER STERILE IRR 1000ML POUR (IV SOLUTION) IMPLANT
YANKAUER SUCT BULB TIP NO VENT (SUCTIONS) ×6 IMPLANT

## 2014-04-10 NOTE — Progress Notes (Signed)
Chaplain requested for a second organ donation ceremony with night shift staff. Provided spiritual and emotional support to family through prayer, empathic listening, and presence until they left hospital. Family greatly appreciated chaplain support, support of CDS representative, and the compassionate care and support of nursing staff and medical team.   Maurene CapesHillary D Irusta [phone]
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
## 2014-04-10 NOTE — Clinical Documentation Improvement (Signed)
Please document  if either of the following diagnoses are consistent  with patient's dx of TBI/SAH?  Thank you   Brain herniation               Cerebral edema  Other   Unable to determine  Unknown   Sign & Symptoms: unresponsive ,  Respiratory failure , cardiac arrest  Diagnostics: CT Scan of head  Treatment: Intubated, IV Solumedrol, IV Levophed, IV Pitressin   Thank you, Lavonda JumboLawanda J Sonoma Firkus ,RN Clinical Documentation Specialist:  [phone]  [facility] Information Management

## 2014-04-10 NOTE — Progress Notes (Signed)
Chaplain requested for spiritual and emotional support during organ donation ceremony with family, staff, and CDS representative. Provided spiritual and emotional support through compassionate presence, empathic listening, and prayer. Available for follow up throughout the night. Please page when needed.   Maurene CapesHillary D Irusta [phone]

## 2014-04-10 NOTE — Progress Notes (Signed)
  Echocardiogram 2D Echocardiogram has been performed.  Georgian CoWILLIAMS, Merrick Maggio September 17, 2013, 8:50 AM

## 2014-04-11 ENCOUNTER — Encounter (HOSPITAL_COMMUNITY): Payer: Self-pay

## 2014-04-11 ENCOUNTER — Inpatient Hospital Stay (HOSPITAL_COMMUNITY): Payer: BC Managed Care – PPO | Admitting: Anesthesiology

## 2014-04-11 ENCOUNTER — Encounter (HOSPITAL_COMMUNITY): Payer: BC Managed Care – PPO | Admitting: Anesthesiology

## 2014-04-11 LAB — POCT I-STAT 7, (LYTES, BLD GAS, ICA,H+H)
ACID-BASE EXCESS: 2 mmol/L (ref 0.0–2.0)
ACID-BASE EXCESS: 4 mmol/L — AB (ref 0.0–2.0)
Acid-Base Excess: 3 mmol/L — ABNORMAL HIGH (ref 0.0–2.0)
Acid-Base Excess: 1 mmol/L (ref 0.0–2.0)
Acid-Base Excess: 2 mmol/L (ref 0.0–2.0)
Acid-Base Excess: 3 mmol/L — ABNORMAL HIGH (ref 0.0–2.0)
BICARBONATE: 27 meq/L — AB (ref 20.0–24.0)
BICARBONATE: 26.6 meq/L — AB (ref 20.0–24.0)
BICARBONATE: 27.9 meq/L — AB (ref 20.0–24.0)
Bicarbonate: 25.4 meq/L — ABNORMAL HIGH (ref 20.0–24.0)
Bicarbonate: 25.8 meq/L — ABNORMAL HIGH (ref 20.0–24.0)
Bicarbonate: 27.4 meq/L — ABNORMAL HIGH (ref 20.0–24.0)
Bicarbonate: 27.7 meq/L — ABNORMAL HIGH (ref 20.0–24.0)
CALCIUM ION: 1.21 mmol/L (ref 1.12–1.23)
CALCIUM ION: 1.22 mmol/L (ref 1.12–1.23)
Calcium, Ion: 1.21 mmol/L (ref 1.12–1.23)
Calcium, Ion: 1.2 mmol/L (ref 1.12–1.23)
Calcium, Ion: 1.21 mmol/L (ref 1.12–1.23)
Calcium, Ion: 1.23 mmol/L (ref 1.12–1.23)
Calcium, Ion: 1.27 mmol/L — ABNORMAL HIGH (ref 1.12–1.23)
HCT: 16 % — ABNORMAL LOW (ref 39.0–52.0)
HCT: 17 % — ABNORMAL LOW (ref 39.0–52.0)
HCT: 20 % — ABNORMAL LOW (ref 39.0–52.0)
HCT: 27 % — ABNORMAL LOW (ref 39.0–52.0)
HEMATOCRIT: 18 % — AB (ref 39.0–52.0)
HEMATOCRIT: 22 % — AB (ref 39.0–52.0)
HEMATOCRIT: 24 % — AB (ref 39.0–52.0)
HEMOGLOBIN: 5.4 g/dL — AB (ref 13.0–17.0)
Hemoglobin: 6.1 g/dL — CL (ref 13.0–17.0)
Hemoglobin: 5.8 g/dL — CL (ref 13.0–17.0)
Hemoglobin: 6.8 g/dL — CL (ref 13.0–17.0)
Hemoglobin: 7.5 g/dL — ABNORMAL LOW (ref 13.0–17.0)
Hemoglobin: 8.2 g/dL — ABNORMAL LOW (ref 13.0–17.0)
Hemoglobin: 9.2 g/dL — ABNORMAL LOW (ref 13.0–17.0)
O2 SAT: 100 %
O2 SAT: 100 %
O2 Saturation: 100 %
O2 Saturation: 100 %
O2 Saturation: 100 %
O2 Saturation: 100 %
O2 Saturation: 100 %
PCO2 ART: 42.5 mmHg (ref 35.0–45.0)
PCO2 ART: 43.2 mmHg (ref 35.0–45.0)
PH ART: 7.374 (ref 7.350–7.450)
PH ART: 7.404 (ref 7.350–7.450)
PH ART: 7.451 — AB (ref 7.350–7.450)
PO2 ART: 243 mmHg — AB (ref 80.0–100.0)
PO2 ART: 584 mmHg — AB (ref 80.0–100.0)
POTASSIUM: 4.5 meq/L (ref 3.7–5.3)
POTASSIUM: 4.6 meq/L (ref 3.7–5.3)
POTASSIUM: 4.7 meq/L (ref 3.7–5.3)
Potassium: 4.5 meq/L (ref 3.7–5.3)
Potassium: 4.5 meq/L (ref 3.7–5.3)
Potassium: 4.6 meq/L (ref 3.7–5.3)
Potassium: 4.7 meq/L (ref 3.7–5.3)
SODIUM: 150 meq/L — AB (ref 137–147)
SODIUM: 150 meq/L — AB (ref 137–147)
Sodium: 150 meq/L — ABNORMAL HIGH (ref 137–147)
Sodium: 150 meq/L — ABNORMAL HIGH (ref 137–147)
Sodium: 150 meq/L — ABNORMAL HIGH (ref 137–147)
Sodium: 150 meq/L — ABNORMAL HIGH (ref 137–147)
Sodium: 150 meq/L — ABNORMAL HIGH (ref 137–147)
TCO2: 28 mmol/L (ref 0–100)
TCO2: 27 mmol/L (ref 0–100)
TCO2: 27 mmol/L (ref 0–100)
TCO2: 28 mmol/L (ref 0–100)
TCO2: 29 mmol/L (ref 0–100)
TCO2: 29 mmol/L (ref 0–100)
TCO2: 29 mmol/L (ref 0–100)
pCO2 arterial: 39.8 mmHg (ref 35.0–45.0)
pCO2 arterial: 38 mmHg (ref 35.0–45.0)
pCO2 arterial: 39.7 mmHg (ref 35.0–45.0)
pCO2 arterial: 43.6 mmHg (ref 35.0–45.0)
pCO2 arterial: 47.6 mmHg — ABNORMAL HIGH (ref 35.0–45.0)
pH, Arterial: 7.44 (ref 7.350–7.450)
pH, Arterial: 7.368 (ref 7.350–7.450)
pH, Arterial: 7.385 (ref 7.350–7.450)
pH, Arterial: 7.475 — ABNORMAL HIGH (ref 7.350–7.450)
pO2, Arterial: 414 mmHg — ABNORMAL HIGH (ref 80.0–100.0)
pO2, Arterial: 191 mmHg — ABNORMAL HIGH (ref 80.0–100.0)
pO2, Arterial: 376 mmHg — ABNORMAL HIGH (ref 80.0–100.0)
pO2, Arterial: 403 mmHg — ABNORMAL HIGH (ref 80.0–100.0)
pO2, Arterial: 581 mmHg — ABNORMAL HIGH (ref 80.0–100.0)

## 2014-04-11 MED ORDER — PHENYLEPHRINE HCL 10 MG/ML IJ SOLN
10.0000 mg | INTRAVENOUS | Status: DC | PRN
  Administered 2014-04-11: 25 ug/min via INTRAVENOUS

## 2014-04-11 MED ORDER — 0.9 % SODIUM CHLORIDE (POUR BTL) OPTIME
TOPICAL | Status: DC | PRN
  Administered 2014-04-11 (×4): 1000 mL

## 2014-04-11 MED ORDER — STERILE WATER FOR INJECTION IJ SOLN
INTRAMUSCULAR | Status: AC
  Filled 2014-04-11: qty 10

## 2014-04-11 MED ORDER — PHENYLEPHRINE 40 MCG/ML (10ML) SYRINGE FOR IV PUSH (FOR BLOOD PRESSURE SUPPORT)
PREFILLED_SYRINGE | INTRAVENOUS | Status: AC
  Filled 2014-04-11: qty 10

## 2014-04-11 MED ORDER — ROCURONIUM BROMIDE 100 MG/10ML IV SOLN
INTRAVENOUS | Status: DC | PRN
  Administered 2014-04-11: 50 mg via INTRAVENOUS

## 2014-04-11 MED ORDER — HEPARIN SODIUM (PORCINE) 1000 UNIT/ML IJ SOLN
INTRAMUSCULAR | Status: DC | PRN
  Administered 2014-04-11: 30000 [IU] via INTRAVENOUS

## 2014-04-11 MED ORDER — HEPARIN SODIUM (PORCINE) 1000 UNIT/ML IJ SOLN
INTRAMUSCULAR | Status: AC
  Filled 2014-04-11: qty 1

## 2014-04-11 MED ORDER — SODIUM CHLORIDE 0.9 % IV SOLN
INTRAVENOUS | Status: DC | PRN
  Administered 2014-04-11 (×2): via INTRAVENOUS

## 2014-04-11 MED ORDER — ROCURONIUM BROMIDE 50 MG/5ML IV SOLN
INTRAVENOUS | Status: AC
  Filled 2014-04-11: qty 2

## 2014-04-11 NOTE — Anesthesia Postprocedure Evaluation (Signed)
  Anesthesia Post-op Note  Patient: Jonathan Stewart  Procedure(s) Performed: Procedure(s): ORGAN PROCUREMENT (N/A)  Patient Location: OR  Anesthesia Type:General  Level of Consciousness: Patient expired  Airway and Oxygen Therapy: Patient remains intubated per anesthesia plan  Post-op Pain: none  Post-op Assessment: Post-op Vital signs reviewed  Post-op Vital Signs: Reviewed  Last Vitals:  Filed Vitals:   03/29/2014 0015  BP: 116/53  Pulse: 108  Temp:   Resp: 12    Complications: No apparent anesthesia complications

## 2014-04-11 NOTE — Transfer of Care (Signed)
Immediate Anesthesia Transfer of Care Note  Patient: Jonathan Stewart  Procedure(s) Performed: Procedure(s): ORGAN PROCUREMENT (N/A)  Patient Location: Pt remains in OR - care turned over to transplant team  Anesthesia Type:General  Level of Consciousness: unresponsive  Airway & Oxygen Therapy: Pt expired  Post-op Assessment: pt expired  Post vital signs: none    Complications: No apparent anesthesia complications

## 2014-04-11 NOTE — OR Nursing (Addendum)
Cross clamp @0258 . Lungs out @0320 .

## 2014-04-11 NOTE — Anesthesia Preprocedure Evaluation (Signed)
Anesthesia Evaluation  Patient identified by MRN, date of birth, ID band Patient unresponsive    Reviewed: Allergy & Precautions, H&P , NPO status , Patient's Chart, lab work & pertinent test results  Airway       Dental   Pulmonary  breath sounds clear to auscultation        Cardiovascular Rhythm:Regular Rate:Tachycardia     Neuro/Psych    GI/Hepatic   Endo/Other    Renal/GU      Musculoskeletal   Abdominal   Peds  Hematology   Anesthesia Other Findings   Reproductive/Obstetrics                           Anesthesia Physical Anesthesia Plan  ASA: VI  Anesthesia Plan: General   Post-op Pain Management:    Induction: Intravenous  Airway Management Planned: Oral ETT  Additional Equipment:   Intra-op Plan:   Post-operative Plan:   Informed Consent: I have reviewed the patients History and Physical, chart, labs and discussed the procedure including the risks, benefits and alternatives for the proposed anesthesia with the patient or authorized representative who has indicated his/her understanding and acceptance.     Plan Discussed with:   Anesthesia Plan Comments:         Anesthesia Quick Evaluation

## 2014-04-11 NOTE — OR Nursing (Addendum)
Heart out @ 0315. Liver out @ 0358 Both kidneys out @ 0409

## 2014-04-12 LAB — TYPE AND SCREEN
ABO/RH(D): A NEG
ANTIBODY SCREEN: NEGATIVE
PT AG TYPE: NEGATIVE
UNIT DIVISION: 0
UNIT DIVISION: 0
Unit division: 0
Unit division: 0
Weak D: NEGATIVE

## 2014-04-12 LAB — CULTURE, BAL-QUANTITATIVE

## 2014-04-12 LAB — CULTURE, BAL-QUANTITATIVE W GRAM STAIN
Colony Count: 40000
Special Requests: NORMAL

## 2014-04-14 DEATH — deceased

## 2014-04-16 DIAGNOSIS — S066XAA Traumatic subarachnoid hemorrhage with loss of consciousness status unknown, initial encounter: Secondary | ICD-10-CM

## 2014-04-16 DIAGNOSIS — R578 Other shock: Secondary | ICD-10-CM | POA: Diagnosis not present

## 2014-04-16 DIAGNOSIS — S066X9A Traumatic subarachnoid hemorrhage with loss of consciousness of unspecified duration, initial encounter: Secondary | ICD-10-CM

## 2014-04-16 DIAGNOSIS — S2220XA Unspecified fracture of sternum, initial encounter for closed fracture: Secondary | ICD-10-CM

## 2014-04-16 DIAGNOSIS — S42009A Fracture of unspecified part of unspecified clavicle, initial encounter for closed fracture: Secondary | ICD-10-CM

## 2014-04-16 DIAGNOSIS — I469 Cardiac arrest, cause unspecified: Secondary | ICD-10-CM | POA: Diagnosis present

## 2014-04-16 DIAGNOSIS — S32309A Unspecified fracture of unspecified ilium, initial encounter for closed fracture: Secondary | ICD-10-CM

## 2014-04-16 LAB — CULTURE, BLOOD (ROUTINE X 2)
CULTURE: NO GROWTH
CULTURE: NO GROWTH
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
## 2014-04-17 NOTE — Discharge Summary (Signed)
Maliaka Brasington, MD, MPH, FACS Trauma: [phone] General Surgery: [phone]  
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
## 2014-05-07 LAB — FUNGUS CULTURE W SMEAR
Fungal Smear: NONE SEEN
SPECIAL REQUESTS: NORMAL

## 2014-05-16 ENCOUNTER — Encounter: Payer: Self-pay | Admitting: Family Medicine

## 2014-05-22 LAB — AFB CULTURE WITH SMEAR (NOT AT ARMC)
ACID FAST SMEAR: NONE SEEN
SPECIAL REQUESTS: NORMAL
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
## 2018-03-04 ENCOUNTER — Encounter: Payer: Self-pay | Admitting: Family Medicine
